# Patient Record
Sex: Male | Born: 1995 | Race: White | Hispanic: No | Marital: Married | Smoking: Never smoker
Health system: Southern US, Community
[De-identification: ages and names within clinical notes are randomized; demographics above are authoritative.]

## PROBLEM LIST (undated history)

## (undated) HISTORY — PX: SKIN GRAFT: SHX250

---

## 2020-03-17 ENCOUNTER — Emergency Department (HOSPITAL_BASED_OUTPATIENT_CLINIC_OR_DEPARTMENT_OTHER)
Admission: EM | Admit: 2020-03-17 | Discharge: 2020-03-17 | Disposition: A | Discharge status: Home / Self Care | Payer: No Typology Code available for payment source | Attending: Emergency Medicine | Admitting: Emergency Medicine

## 2020-03-17 ENCOUNTER — Emergency Department (HOSPITAL_BASED_OUTPATIENT_CLINIC_OR_DEPARTMENT_OTHER): Payer: No Typology Code available for payment source

## 2020-03-17 ENCOUNTER — Other Ambulatory Visit: Payer: Self-pay

## 2020-03-17 ENCOUNTER — Encounter (HOSPITAL_BASED_OUTPATIENT_CLINIC_OR_DEPARTMENT_OTHER): Payer: Self-pay

## 2020-03-17 DIAGNOSIS — Y990 Civilian activity done for income or pay: Secondary | ICD-10-CM | POA: Diagnosis not present

## 2020-03-17 DIAGNOSIS — Y92812 Truck as the place of occurrence of the external cause: Secondary | ICD-10-CM | POA: Insufficient documentation

## 2020-03-17 DIAGNOSIS — S4992XA Unspecified injury of left shoulder and upper arm, initial encounter: Secondary | ICD-10-CM | POA: Diagnosis present

## 2020-03-17 DIAGNOSIS — W230XXA Caught, crushed, jammed, or pinched between moving objects, initial encounter: Secondary | ICD-10-CM | POA: Diagnosis not present

## 2020-03-17 DIAGNOSIS — S50812A Abrasion of left forearm, initial encounter: Secondary | ICD-10-CM | POA: Insufficient documentation

## 2020-03-17 NOTE — ED Provider Notes (Signed)
[facility] EMERGENCY DEPARTMENT Provider Note   CSN: [id number] Arrival date & time: 03/17/20  1326     History Chief Complaint  Patient presents with  . Arm Injury    Curtis Chambers is a 25 y.o. male.  HPI Patient is a 25 year old male presenting today with left forearm pain after his sliding 18 wheeler cargo truck door rolled closed under his arm today at work at approximately 11 AM this morning.  He states that he has an abrasion on his arm which is irritating states that it was significant pain at first however has improved since that time.  He states that his pain is now dull and achy and more both 3/10.  He has taken no medications for pain prior to arrival or during his ER stay yet.  He denies any numbness or weakness of the upper extremity.  He denies any significant swelling Denies any significant weakness. No other associated symptoms no other aggravating or mitigating factors apart from worse with pushing on the abrasion.    History reviewed. No pertinent past medical history.  There are no problems to display for this patient.   Past Surgical History:  Procedure Laterality Date  . SKIN GRAFT         No family history on file.  Social History   Tobacco Use  . Smoking status: Never Smoker  . Smokeless tobacco: Current User  Vaping Use  . Vaping Use: Never used  Substance Use Topics  . Alcohol use: Yes    Comment: occ  . Drug use: Never    Home Medications Prior to Admission medications   Not on File    Allergies    Patient has no known allergies.  Review of Systems   Review of Systems  Constitutional: Negative for fever.  HENT: Negative for congestion.   Respiratory: Negative for shortness of breath.   Cardiovascular: Negative for chest pain.  Gastrointestinal: Negative for abdominal distention.  Musculoskeletal:       Forearm pain  Neurological: Negative for dizziness and headaches.    Physical Exam Updated Vital Signs BP  138/81 (BP Location: Right Arm)   Pulse 77   Temp 98.4 F (36.9 C) (Oral)   Resp 16   Ht 5\' 10"  (1.778 m)   Wt 104.3 kg   SpO2 100%   BMI 33.00 kg/m   Physical Exam Vitals and nursing note reviewed.  Constitutional:      General: He is not in acute distress.    Appearance: Normal appearance. He is not ill-appearing.  HENT:     Head: Normocephalic and atraumatic.  Eyes:     General: No scleral icterus.       Right eye: No discharge.        Left eye: No discharge.     Conjunctiva/sclera: Conjunctivae normal.  Pulmonary:     Effort: Pulmonary effort is normal.     Breath sounds: No stridor.  Musculoskeletal:     Comments: Left forearm with abrasion over the dorsal aspect approximately mid forearm. Compartments are soft nontender to palpation.  There is no bony tenderness.  No bruising or laceration.  Sensation intact in all fingertips able to make okay sign, extend wrist and abduct thumb to pinky.   Skin:    General: Skin is warm and dry.     Capillary Refill: Capillary refill takes less than 2 seconds.     Comments: Small abrasion on dorsal forearm  Neurological:     Mental  Status: He is alert and oriented to person, place, and time. Mental status is at baseline.     ED Results / Procedures / Treatments   Labs (all labs ordered are listed, but only abnormal results are displayed) Labs Reviewed - No data to display  EKG None  Radiology DG Forearm Left  Result Date: 03/17/2020 CLINICAL DATA:  Sherrine Maples to the left forearm on a trailer truck door today. Initial encounter. EXAM: LEFT FOREARM - 2 VIEW COMPARISON:  None. FINDINGS: There is no evidence of fracture or other focal bone lesions. Soft tissues are unremarkable. IMPRESSION: Negative exam. Electronically Signed   By: Drusilla Kanner M.D.   On: 03/17/2020 14:09    Procedures Procedures (including critical care time)  Medications Ordered in ED Medications - No data to display  ED Course  I have reviewed the  triage vital signs and the nursing notes.  Pertinent labs & imaging results that were available during my care of the patient were reviewed by me and considered in my medical decision making (see chart for details).    MDM Rules/Calculators/A&P                          Patient is 25 year old male presented today with left forearm pain physical exam is unremarkable apart from small abrasion right forearm and some tenderness.  He has soft compartments.  X-ray shows no fracture.  Will discharge with Tylenol ibuprofen and follow-up with PCP will return to the ER for new or concerning symptoms I discussed with patient and educated him on symptoms of compartment syndrome.  He is understanding of plan to discharge this time.  No further questions at this time.  Final Clinical Impression(s) / ED Diagnoses Final diagnoses:  Injury of left upper extremity, initial encounter    Rx / DC Orders ED Discharge Orders    None       Gailen Shelter, [place] 03/17/20 1916    Vanetta Mulders, MD 03/19/20 1859

## 2020-03-17 NOTE — Discharge Instructions (Signed)
Your x-ray was negative for fracture.  You may always return to the ER for any new or concerning symptoms such as specific symptoms we discussed which are numbness, cold hand, decreased sensation or other new or concerning symptoms.  Please use Tylenol or ibuprofen for pain.  You may use 600 mg ibuprofen every 6 hours or 1000 mg of Tylenol every 6 hours.  You may choose to alternate between the 2.  This would be most effective.  Not to exceed 4 g of Tylenol within 24 hours.  Not to exceed 3200 mg ibuprofen 24 hours.

## 2020-03-17 NOTE — ED Triage Notes (Addendum)
Pt states a trailer door came down on left UE at work ~11am-abrasion to left FA-NAD-steady gait-pt's [employer] paper work does not have any drug testing options selected

## 2021-10-03 ENCOUNTER — Emergency Department (EMERGENCY_DEPARTMENT_HOSPITAL): Payer: Managed Care, Other (non HMO) | Admitting: Certified Registered Nurse Anesthetist

## 2021-10-03 ENCOUNTER — Emergency Department (HOSPITAL_COMMUNITY): Payer: Managed Care, Other (non HMO) | Admitting: Certified Registered Nurse Anesthetist

## 2021-10-03 ENCOUNTER — Ambulatory Visit (HOSPITAL_BASED_OUTPATIENT_CLINIC_OR_DEPARTMENT_OTHER)
Admission: EM | Admit: 2021-10-03 | Discharge: 2021-10-04 | Disposition: A | Discharge status: Home / Self Care | Payer: Managed Care, Other (non HMO) | Attending: Emergency Medicine | Admitting: Emergency Medicine

## 2021-10-03 ENCOUNTER — Encounter (HOSPITAL_COMMUNITY)
Admission: EM | Disposition: A | Discharge status: Home / Self Care | Payer: Self-pay | Source: Home / Self Care | Attending: Emergency Medicine

## 2021-10-03 ENCOUNTER — Encounter (HOSPITAL_BASED_OUTPATIENT_CLINIC_OR_DEPARTMENT_OTHER): Payer: Self-pay | Admitting: Emergency Medicine

## 2021-10-03 ENCOUNTER — Other Ambulatory Visit: Payer: Self-pay

## 2021-10-03 DIAGNOSIS — T18128A Food in esophagus causing other injury, initial encounter: Secondary | ICD-10-CM

## 2021-10-03 DIAGNOSIS — K222 Esophageal obstruction: Secondary | ICD-10-CM

## 2021-10-03 DIAGNOSIS — K2289 Other specified disease of esophagus: Secondary | ICD-10-CM | POA: Diagnosis not present

## 2021-10-03 DIAGNOSIS — X58XXXA Exposure to other specified factors, initial encounter: Secondary | ICD-10-CM | POA: Diagnosis not present

## 2021-10-03 HISTORY — PX: FOREIGN BODY REMOVAL: SHX962

## 2021-10-03 HISTORY — PX: BIOPSY: SHX5522

## 2021-10-03 HISTORY — PX: ESOPHAGOGASTRODUODENOSCOPY: SHX5428

## 2021-10-03 SURGERY — EGD (ESOPHAGOGASTRODUODENOSCOPY)
Anesthesia: General

## 2021-10-03 MED ORDER — ONDANSETRON HCL 4 MG/2ML IJ SOLN
INTRAMUSCULAR | Status: DC | PRN
  Administered 2021-10-03: 4 mg via INTRAVENOUS

## 2021-10-03 MED ORDER — FENTANYL CITRATE (PF) 100 MCG/2ML IJ SOLN
INTRAMUSCULAR | Status: DC | PRN
  Administered 2021-10-03: 100 ug via INTRAVENOUS

## 2021-10-03 MED ORDER — SUCCINYLCHOLINE CHLORIDE 200 MG/10ML IV SOSY
PREFILLED_SYRINGE | INTRAVENOUS | Status: DC | PRN
  Administered 2021-10-03: 140 mg via INTRAVENOUS

## 2021-10-03 MED ORDER — NITROGLYCERIN 0.4 MG SL SUBL
0.4000 mg | SUBLINGUAL_TABLET | Freq: Once | SUBLINGUAL | Status: AC
  Administered 2021-10-03: 0.4 mg via SUBLINGUAL
  Filled 2021-10-03: qty 1

## 2021-10-03 MED ORDER — LACTATED RINGERS IV SOLN
INTRAVENOUS | Status: AC | PRN
  Administered 2021-10-03: 20 mL/h via INTRAVENOUS

## 2021-10-03 MED ORDER — FENTANYL CITRATE (PF) 100 MCG/2ML IJ SOLN
INTRAMUSCULAR | Status: AC
  Filled 2021-10-03: qty 2

## 2021-10-03 MED ORDER — ONDANSETRON HCL 4 MG/2ML IJ SOLN: 4.0000 mg | Freq: Once | INTRAMUSCULAR | Status: DC | PRN

## 2021-10-03 MED ORDER — PROPOFOL 10 MG/ML IV BOLUS
INTRAVENOUS | Status: AC
  Filled 2021-10-03: qty 20

## 2021-10-03 MED ORDER — ONDANSETRON HCL 4 MG/2ML IJ SOLN
4.0000 mg | Freq: Once | INTRAMUSCULAR | Status: AC
  Administered 2021-10-03: 4 mg via INTRAVENOUS
  Filled 2021-10-03: qty 2

## 2021-10-03 MED ORDER — LIDOCAINE HCL (CARDIAC) PF 100 MG/5ML IV SOSY
PREFILLED_SYRINGE | INTRAVENOUS | Status: DC | PRN
Start: 2021-10-03 — End: 2021-10-04
  Administered 2021-10-03: 100 mg via INTRAVENOUS

## 2021-10-03 MED ORDER — GLUCAGON HCL RDNA (DIAGNOSTIC) 1 MG IJ SOLR
1.0000 mg | Freq: Once | INTRAMUSCULAR | Status: AC
  Administered 2021-10-03: 1 mg via INTRAVENOUS
  Filled 2021-10-03: qty 1

## 2021-10-03 MED ORDER — FENTANYL CITRATE (PF) 100 MCG/2ML IJ SOLN: 25.0000 ug | INTRAMUSCULAR | Status: DC | PRN

## 2021-10-03 MED ORDER — PROPOFOL 10 MG/ML IV BOLUS
INTRAVENOUS | Status: DC | PRN
  Administered 2021-10-03: 200 mg via INTRAVENOUS

## 2021-10-03 NOTE — ED Notes (Signed)
Pt given po challenge of Soda and had no result, unable to swallow at this time,

## 2021-10-03 NOTE — ED Notes (Signed)
Patient is alert x4 Breathing non labored Denies any shortness of breath

## 2021-10-03 NOTE — H&P (Signed)
[facility] H/P Note  Chief Complaint: esophageal foreign body  HPI: Curtis Chambers is an 26 y.o. male.  Suspected esophageal foreign body (steak) abruptly after eating this evening.  No prior issues with dysphagia.  Had some trouble breath as well, initially, requiring abdominal thrust maneuver.  No abdominal pain, blood in stool, unintentional weight loss.  No prior endoscopy or colonoscopy.  History reviewed. No pertinent past medical history.  Past Surgical History:  Procedure Laterality Date   SKIN GRAFT      No medications prior to admission.    Allergies:  Allergies  Allergen Reactions   Cephalexin Rash    Informant mother Informant mother    Penicillins Rash    History reviewed. No pertinent family history.  Social History:  reports that he has never smoked. He uses smokeless tobacco. He reports current alcohol use. He reports that he does not use drugs.   ROS: As per HPI, all others negative   Blood pressure (!) 169/102, pulse 73, temperature 97.7 F (36.5 C), temperature source Temporal, resp. rate 14, height 5\' 10"  (1.778 m), weight 102.1 kg, SpO2 100 %. General appearance: NAD HEENT:  [place]/AT, anicteric NECK:  Thick, supple CV:  Regular ABD:  Soft, non-tender SKIN:  No jaundice NEURO:  A/O, no encephalopathy  No results found for this or any previous visit (from the past 48 hour(s)). No results found.  Assessment/Plan   Suspected esophageal food bolus. Endoscopy with possible foreign body removal. Risks (bleeding, infection, bowel perforation that could require surgery, sedation-related changes in cardiopulmonary systems), benefits (identification and possible treatment of source of symptoms, exclusion of certain causes of symptoms), and alternatives (watchful waiting, radiographic imaging studies, empiric medical treatment) of upper endoscopy (EGD) were explained to patient/family in detail and patient wishes to proceed.   10/03/2021, 11:09 PM

## 2021-10-03 NOTE — ED Notes (Signed)
IV secured with coban, pt stable for transfer

## 2021-10-03 NOTE — ED Notes (Signed)
Pt left our facility POV to head to [facility], pt wife to drive.

## 2021-10-03 NOTE — ED Notes (Signed)
ED Provider at bedside. 

## 2021-10-03 NOTE — ED Triage Notes (Signed)
Pt arrives pov, endorses choking on a piece of steak 45 mins pta. Pt reports still feels like portion is still stuck, unable to keep water down.

## 2021-10-03 NOTE — ED Provider Notes (Signed)
[facility] EMERGENCY DEPARTMENT Provider Note   CSN: [id number] Arrival date & time: 10/03/21  1835     History  Chief Complaint  Patient presents with   Foreign Body    Curtis Chambers is a 26 y.o. male.  HPI     26 year old male comes in with chief complaint of foreign body.  Patient states that he was having steak at home about an hour prior to ED arrival when he started choking all of a sudden.  His father had to perform Heimlich maneuver to help him because he was also having difficulty in breathing.  He spit out a big piece of steak, but continues to have difficulty in swallowing.  Anytime he drinks water, it just comes out.  No history of prior food impaction.  Home Medications Prior to Admission medications   Not on File      Allergies    Cephalexin and Penicillins    Review of Systems   Review of Systems  All other systems reviewed and are negative.   Physical Exam Updated Vital Signs BP 126/80 (BP Location: Right Arm)   Pulse 79   Temp 97.6 F (36.4 C) (Oral)   Resp 18   Ht 5\' 10"  (1.778 m)   Wt 102.1 kg   SpO2 98%   BMI 32.28 kg/m  Physical Exam Vitals and nursing note reviewed.  Constitutional:      Appearance: He is well-developed.  HENT:     Head: Atraumatic.  Cardiovascular:     Rate and Rhythm: Normal rate.  Pulmonary:     Effort: Pulmonary effort is normal.  Musculoskeletal:     Cervical back: Neck supple.  Skin:    General: Skin is warm.  Neurological:     Mental Status: He is alert and oriented to person, place, and time.     ED Results / Procedures / Treatments   Labs (all labs ordered are listed, but only abnormal results are displayed) Labs Reviewed - No data to display  EKG EKG Interpretation  Date/Time:  Sunday October 03 2021 18:44:00 EDT Ventricular Rate:  75 PR Interval:  154 QRS Duration: 80 QT Interval:  360 QTC Calculation: 402 R Axis:   59 Text Interpretation: Normal sinus rhythm Normal ECG No  previous ECGs available No old tracing to compare Confirmed by [id number] [phone]) on 10/03/2021 7:59:22 PM  Radiology No results found.  Procedures Procedures    Medications Ordered in ED Medications  glucagon (human recombinant) (GLUCAGEN) injection 1 mg (1 mg Intravenous Given 10/03/21 1931)  ondansetron (ZOFRAN) injection 4 mg (4 mg Intravenous Given 10/03/21 1930)  nitroGLYCERIN (NITROSTAT) SL tablet 0.4 mg (0.4 mg Sublingual Given 10/03/21 1931)    ED Course/ Medical Decision Making/ A&P                           Medical Decision Making Risk Prescription drug management.   26 year old patient comes in with chief complaint of difficulty in swallowing.  It appears that he had a choking spell prior to ED arrival.  He has no medical problems.  Subsequently, anytime he has tried to drink water he is spitting out.  I walk into the room, patient appears comfortable but he is sitting with an emesis bag, intermittently having to spit in it.  He is spitting out his own saliva.  Differential diagnosis essentially includes globus pharyngeus versus esophageal food impaction.  Leaning towards the latter given that patient  is constantly spitting out.  Labs and imaging not indicated.  Patient does not have any airway compromise at this time  We gave patient sublingual nitroglycerin, glucagon IV along with Zofran and also attempted carbonated beverage and jumping jacks.  Patient continues to have difficulty swallowing.  I consulted Dr. Dulce Sellar, GI.  He recommends that patient be transferred to [facility] emergency room.  Patient made aware of the plan for ED to ED transfer.  Dr. Stevie Kern made aware at [facility] long ED.  Patient going via private conveyance.  Final Clinical Impression(s) / ED Diagnoses Final diagnoses:  Esophageal obstruction due to food impaction    Rx / DC Orders ED Discharge Orders     None         Derwood Kaplan, MD 10/03/21 2100

## 2021-10-03 NOTE — Anesthesia Preprocedure Evaluation (Signed)
Anesthesia Evaluation  Patient identified by MRN, date of birth, ID band Patient awake    Reviewed: Allergy & Precautions, NPO status , Patient's Chart, lab work & pertinent test results  Airway Mallampati: II  TM Distance: >3 FB Neck ROM: Full    Dental no notable dental hx.    Pulmonary neg pulmonary ROS,    Pulmonary exam normal breath sounds clear to auscultation       Cardiovascular negative cardio ROS Normal cardiovascular exam Rhythm:Regular Rate:Normal     Neuro/Psych negative neurological ROS  negative psych ROS   GI/Hepatic negative GI ROS, Neg liver ROS,   Endo/Other  obesity  Renal/GU negative Renal ROS  negative genitourinary   Musculoskeletal negative musculoskeletal ROS (+)   Abdominal   Peds negative pediatric ROS (+)  Hematology negative hematology ROS (+)   Anesthesia Other Findings   Reproductive/Obstetrics negative OB ROS                             Anesthesia Physical Anesthesia Plan  ASA: 2 and emergent  Anesthesia Plan: General   Post-op Pain Management:    Induction: Intravenous and Rapid sequence  PONV Risk Score and Plan: 2 and Ondansetron and Treatment may vary due to age or medical condition  Airway Management Planned: Oral ETT  Additional Equipment:   Intra-op Plan:   Post-operative Plan: Extubation in OR  Informed Consent: I have reviewed the patients History and Physical, chart, labs and discussed the procedure including the risks, benefits and alternatives for the proposed anesthesia with the patient or authorized representative who has indicated his/her understanding and acceptance.     Dental advisory given  Plan Discussed with: CRNA and Surgeon  Anesthesia Plan Comments:         Anesthesia Quick Evaluation

## 2021-10-03 NOTE — Anesthesia Procedure Notes (Signed)
Procedure Name: Intubation Date/Time: 10/03/2021 11:11 PM  Performed by: Raenette Rover, CRNAPre-anesthesia Checklist: Patient identified, Emergency Drugs available, Suction available and Patient being monitored Patient Re-evaluated:Patient Re-evaluated prior to induction Oxygen Delivery Method: Circle system utilized Preoxygenation: Pre-oxygenation with 100% oxygen Induction Type: IV induction, Rapid sequence and Cricoid Pressure applied Laryngoscope Size: Mac and 4 Grade View: Grade I Tube type: Oral Tube size: 7.5 mm Number of attempts: 1 Airway Equipment and Method: Stylet Placement Confirmation: ETT inserted through vocal cords under direct vision, positive ETCO2 and breath sounds checked- equal and bilateral Secured at: 23 cm Tube secured with: Tape Dental Injury: Teeth and Oropharynx as per pre-operative assessment

## 2021-10-03 NOTE — ED Notes (Signed)
Gastroenterology called to inform them pt has arrived to [facility]

## 2021-10-04 ENCOUNTER — Encounter (HOSPITAL_COMMUNITY): Payer: Self-pay | Admitting: Gastroenterology

## 2021-10-04 NOTE — Discharge Instructions (Addendum)
YOU HAD AN ENDOSCOPIC PROCEDURE TODAY: Refer to the procedure report and other information in the discharge instructions given to you for any specific questions about what was found during the examination. If this information does not answer your questions, please call [facility] office at [phone] to clarify.   YOU SHOULD EXPECT: Some feelings of bloating in the abdomen. Passage of more gas than usual. Walking can help get rid of the air that was put into your GI tract during the procedure and reduce the bloating. If you had a lower endoscopy (such as a colonoscopy or flexible sigmoidoscopy) you may notice spotting of blood in your stool or on the toilet paper. Some abdominal soreness may be present for a day or two, also.  DIET: Your first meal following the procedure should be a light meal and then it is ok to progress to your normal diet. A half-sandwich or bowl of soup is an example of a good first meal. Heavy or fried foods are harder to digest and may make you feel nauseous or bloated. Drink plenty of fluids but you should avoid alcoholic beverages for 24 hours. If you had a esophageal dilation, please see attached instructions for diet.    ACTIVITY: Your care partner should take you home directly after the procedure. You should plan to take it easy, moving slowly for the rest of the day. You can resume normal activity the day after the procedure however YOU SHOULD NOT DRIVE, use power tools, machinery or perform tasks that involve climbing or major physical exertion for 24 hours (because of the sedation medicines used during the test).   SYMPTOMS TO REPORT IMMEDIATELY: A gastroenterologist can be reached at any hour. Please call [phone]  for any of the following symptoms:  Following lower endoscopy (colonoscopy, flexible sigmoidoscopy) Excessive amounts of blood in the stool  Significant tenderness, worsening of abdominal pains  Swelling of the abdomen that is new, acute  Fever of 100  or higher  Following upper endoscopy (EGD, EUS, ERCP, esophageal dilation) Vomiting of blood or coffee ground material  New, significant abdominal pain  New, significant chest pain or pain under the shoulder blades  Painful or persistently difficult swallowing  New shortness of breath  Black, tarry-looking or red, bloody stools  FOLLOW UP:  If any biopsies were taken you will be contacted by phone or by letter within the next 1-3 weeks. Call [phone]  if you have not heard about the biopsies in 3 weeks.  Please also call with any specific questions about appointments or follow up tests. YOU HAD AN ENDOSCOPIC PROCEDURE TODAY: Refer to the procedure report and other information in the discharge instructions given to you for any specific questions about what was found during the examination. If this information does not answer your questions, please call [facility] office at [phone] to clarify.   YOU SHOULD EXPECT: Some feelings of bloating in the abdomen. Passage of more gas than usual. Walking can help get rid of the air that was put into your GI tract during the procedure and reduce the bloating. If you had a lower endoscopy (such as a colonoscopy or flexible sigmoidoscopy) you may notice spotting of blood in your stool or on the toilet paper. Some abdominal soreness may be present for a day or two, also.  DIET: You should have soft diet (mashed potatoes, rice, pudding, well-chopped meats) for the next 24 hours, then slowly advance to regular diet.  Chew food carefully, take small bites, eat  slowly; otherwise, you may have another episode resulting in food in your esophagus. Drink plenty of fluids but you should avoid alcoholic beverages for 24 hours. If you had a esophageal dilation, please see attached instructions for diet.    ACTIVITY: Your care partner should take you home directly after the procedure. You should plan to take it easy, moving slowly for the rest of the day. You can resume  normal activity the day after the procedure however YOU SHOULD NOT DRIVE, YOU SHOULD NOT WORK, use power tools, machinery or perform tasks that involve climbing or major physical exertion for 24 hours (because of the sedation medicines used during the test).   SYMPTOMS TO REPORT IMMEDIATELY: A gastroenterologist can be reached at any hour. Please call [phone]  for any of the following symptoms:  Following upper endoscopy (EGD, EUS, ERCP, esophageal dilation) Vomiting of blood or coffee ground material  New, significant abdominal pain  New, significant chest pain or pain under the shoulder blades  Painful or persistently difficult swallowing  New shortness of breath  Black, tarry-looking or red, bloody stools  FOLLOW UP:  If any biopsies were taken you will be contacted by phone or by letter within the next 1-3 weeks. Call [phone]  if you have not heard about the biopsies in 3 weeks.  Please also call with any specific questions about appointments or follow up tests.

## 2021-10-04 NOTE — ED Provider Notes (Signed)
Clinical Course as of 10/04/21 0441  Wynelle Link Oct 03, 2021  2211 Dr. Dulce Sellar aware of patient arrival to the [facility] ED and will assess him in the department. [KH]  2300 Patient transferred OTF for EGD. [KH]    Clinical Course User Index [KH] Geanie Kenning, PA-C 10/04/21 0441    Tilden Fossa, MD 10/05/21 [phone]

## 2021-10-04 NOTE — Transfer of Care (Signed)
Immediate Anesthesia Transfer of Care Note  Patient: Curtis Chambers  Procedure(s) Performed: ESOPHAGOGASTRODUODENOSCOPY (EGD) FOREIGN BODY REMOVAL BIOPSY  Patient Location: Endoscopy Unit  Anesthesia Type:General  Level of Consciousness: drowsy and patient cooperative  Airway & Oxygen Therapy: Patient Spontanous Breathing and Patient connected to face mask oxygen  Post-op Assessment: Report given to RN and Post -op Vital signs reviewed and stable  Post vital signs: Reviewed and stable  Last Vitals:  Vitals Value Taken Time  BP 170/90 10/04/21 0036  Temp    Pulse 87 10/04/21 0037  Resp 10 10/04/21 0037  SpO2 100 % 10/04/21 0037  Vitals shown include unvalidated device data.  Last Pain:  Vitals:   10/03/21 2305  TempSrc: Temporal  PainSc:          Complications: No notable events documented.

## 2021-10-04 NOTE — Op Note (Signed)
[facility] Patient Name: Curtis Chambers Procedure Date: 10/03/2021 MRN: [id number] Attending MD: Willis Modena , MD Date of Birth: April 13, 1995 CSN: [id number] Age: 26 Admit Type: Outpatient Procedure:                Upper GI endoscopy Indications:              Foreign body in the esophagus Providers:                Willis Modena, MD, Vicki Mallet, RN, Rozetta Nunnery, Technician Referring MD:              Medicines:                General Anesthesia Complications:             Estimated Blood Loss:     Estimated blood loss: none. Procedure:                Pre-Anesthesia Assessment:                           - Prior to the procedure, a History and Physical                            was performed, and patient medications and                            allergies were reviewed. The patient's tolerance of                            previous anesthesia was also reviewed. The risks                            and benefits of the procedure and the sedation                            options and risks were discussed with the patient.                            All questions were answered, and informed consent                            was obtained. Prior Anticoagulants: The patient has                            taken no previous anticoagulant or antiplatelet                            agents. ASA Grade Assessment: II - A patient with                            mild systemic disease. After reviewing the risks                            and  benefits, the patient was deemed in                            satisfactory condition to undergo the procedure.                           After obtaining informed consent, the endoscope was                            passed under direct vision. Throughout the                            procedure, the patient's blood pressure, pulse, and                            oxygen saturations were monitored continuously. The                             [id number]-[id number] ([id number] Olympus endoscope was introduced                            through the mouth, and advanced to the second part                            of duodenum. The upper GI endoscopy was                            accomplished without difficulty. The patient                            tolerated the procedure well. Scope In: Scope Out: Findings:      Mucosal changes consistent with eosinophilic esophagitis were found in       the entire esophagus. Biopsies were obtained from the proximal and       distal esophagus with cold forceps for further evaluation.      Food was found in the middle third of the esophagus and in the lower       third of the esophagus. Removal of food was accomplished.      The exam of the esophagus was otherwise normal.      The entire examined stomach was normal.      The duodenal bulb, first portion of the duodenum and second portion of       the duodenum were normal. Impression:               - Esophageal mucosal changes consistent with                            eosinophilic esophagitis. Biopsied.                           - Food in the middle third of the esophagus and in                            the lower third of the esophagus. Removal was  successful.                           - Normal stomach.                           - Normal duodenal bulb, first portion of the                            duodenum and second portion of the duodenum. Moderate Sedation:      None Recommendation:           - Discharge patient to home (via wheelchair).                           - Soft diet for 1 day.                           - Await pathology results. Treat if EOE confirmed.                           - Return to GI clinic after studies are complete.                           - Return to referring physician as previously                            scheduled. Procedure Code(s):        --- Professional ---                            [phone], Esophagogastroduodenoscopy, flexible,                            transoral; with removal of foreign body(s)                           [id number], Esophagogastroduodenoscopy, flexible,                            transoral; with biopsy, single or multiple Diagnosis Code(s):        --- Professional ---                           K22.8, Other specified diseases of esophagus                           T18.128A, Food in esophagus causing other injury,                            initial encounter                           T18.108A, Unspecified foreign body in esophagus                            causing other injury, initial encounter CPT copyright 2019 [employer]. All  rights reserved. The codes documented in this report are preliminary and upon coder review may  be revised to meet current compliance requirements. Willis Modena, MD 10/04/2021 12:37:49 AM This report has been signed electronically. Number of Addenda: 0

## 2021-10-05 LAB — SURGICAL PATHOLOGY

## 2021-10-05 NOTE — Anesthesia Postprocedure Evaluation (Signed)
Anesthesia Post Note  Patient: Curtis Chambers  Procedure(s) Performed: ESOPHAGOGASTRODUODENOSCOPY (EGD) FOREIGN BODY REMOVAL BIOPSY     Patient location during evaluation: PACU Anesthesia Type: General Level of consciousness: awake and alert Pain management: pain level controlled Vital Signs Assessment: post-procedure vital signs reviewed and stable Respiratory status: spontaneous breathing, nonlabored ventilation, respiratory function stable and patient connected to nasal cannula oxygen Cardiovascular status: blood pressure returned to baseline and stable Postop Assessment: no apparent nausea or vomiting Anesthetic complications: no   No notable events documented.  Last Vitals:  Vitals:   10/04/21 0050 10/04/21 0100  BP: (!) 162/91 (!) 149/91  Pulse: 83 79  Resp: 17 19  Temp:    SpO2: 99% 99%    Last Pain:  Vitals:   10/04/21 0050  TempSrc:   PainSc: 0-No pain                 Kal Chait S

## 2022-08-23 IMAGING — CR DG FOREARM 2V*L*
2 series · 2 of 2 positions shown · non-contrast
Comparison: None.

CLINICAL DATA: Blow to the left forearm on a trailer truck door
today. Initial encounter.

EXAM:
LEFT FOREARM - 2 VIEW

[x forearm ap left]
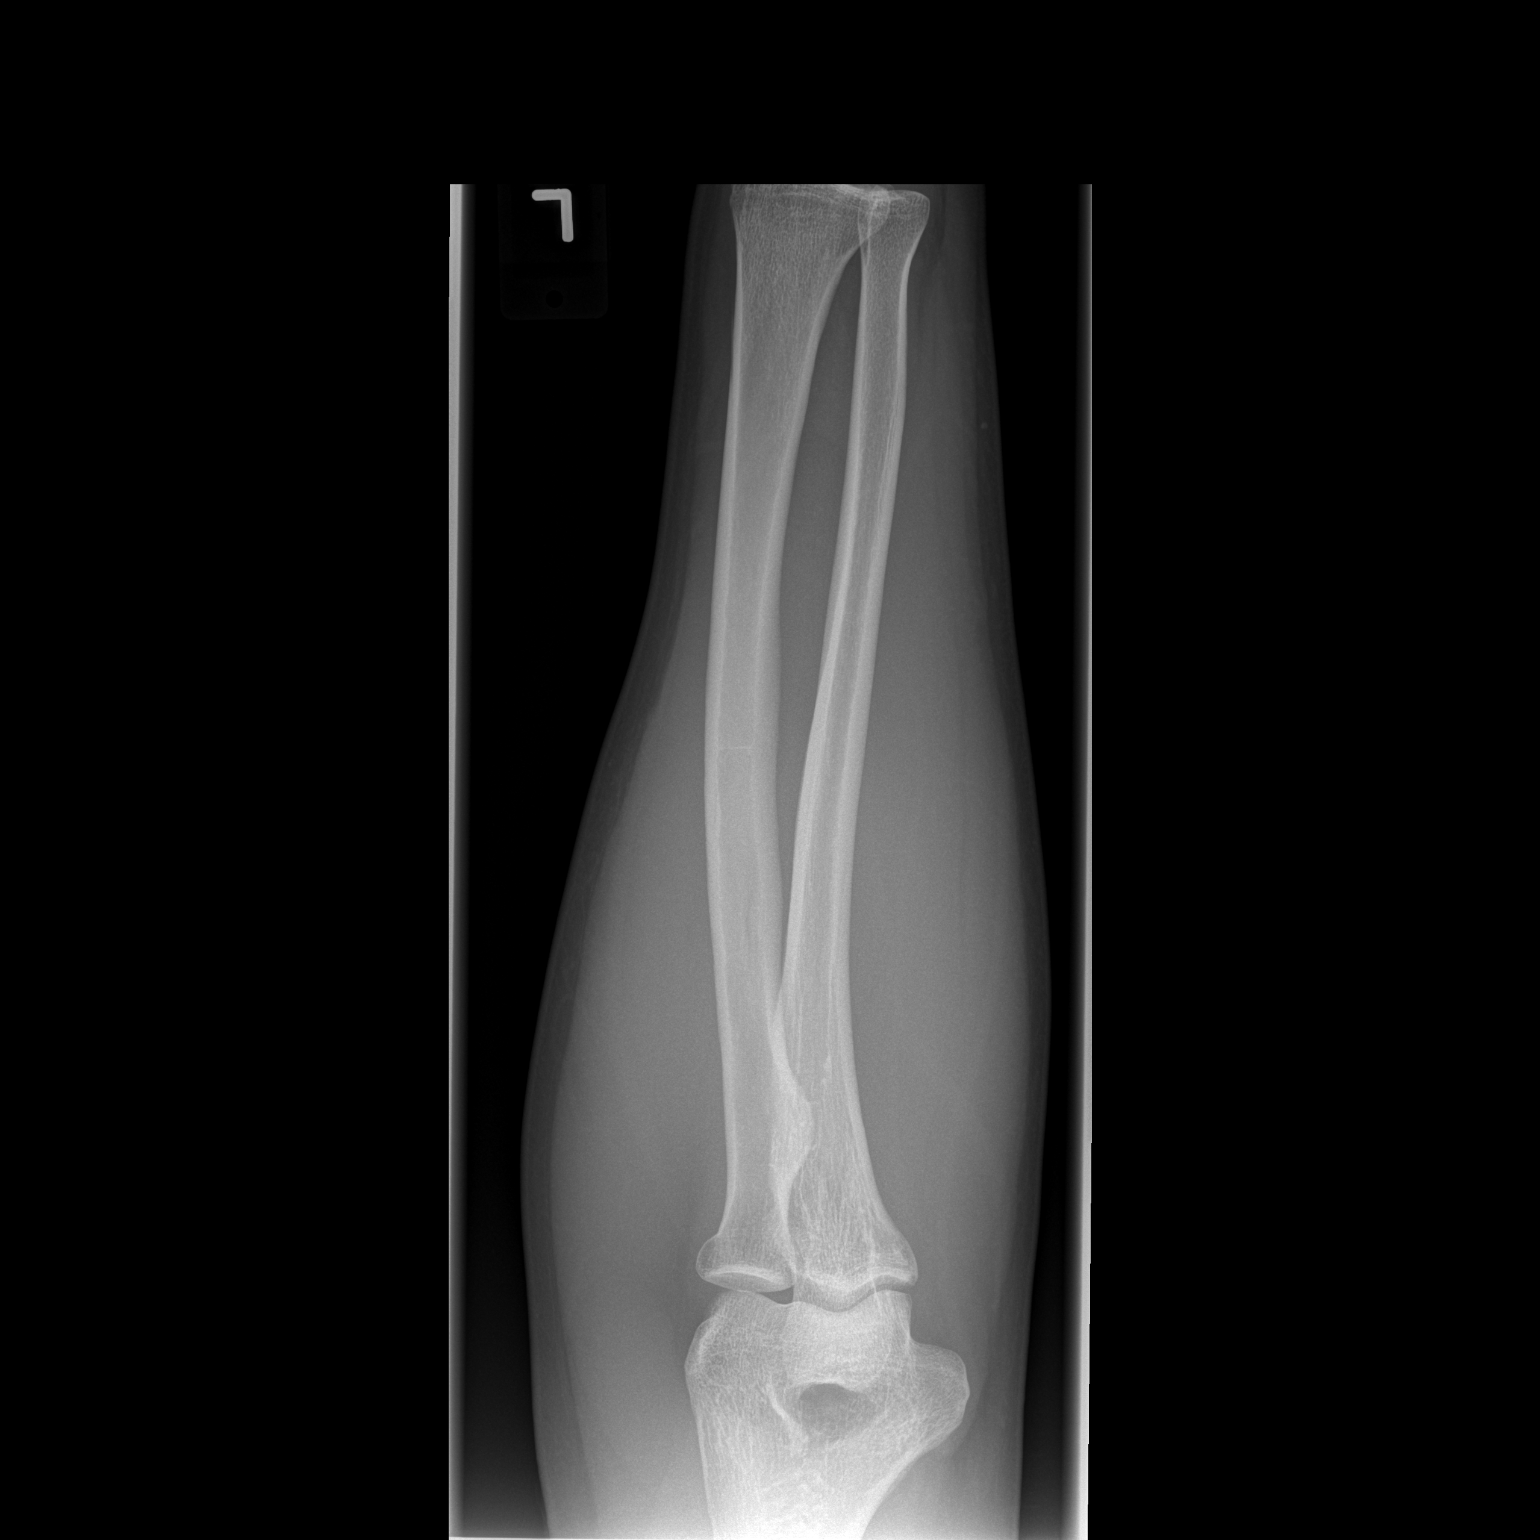

[x forearm lat left]
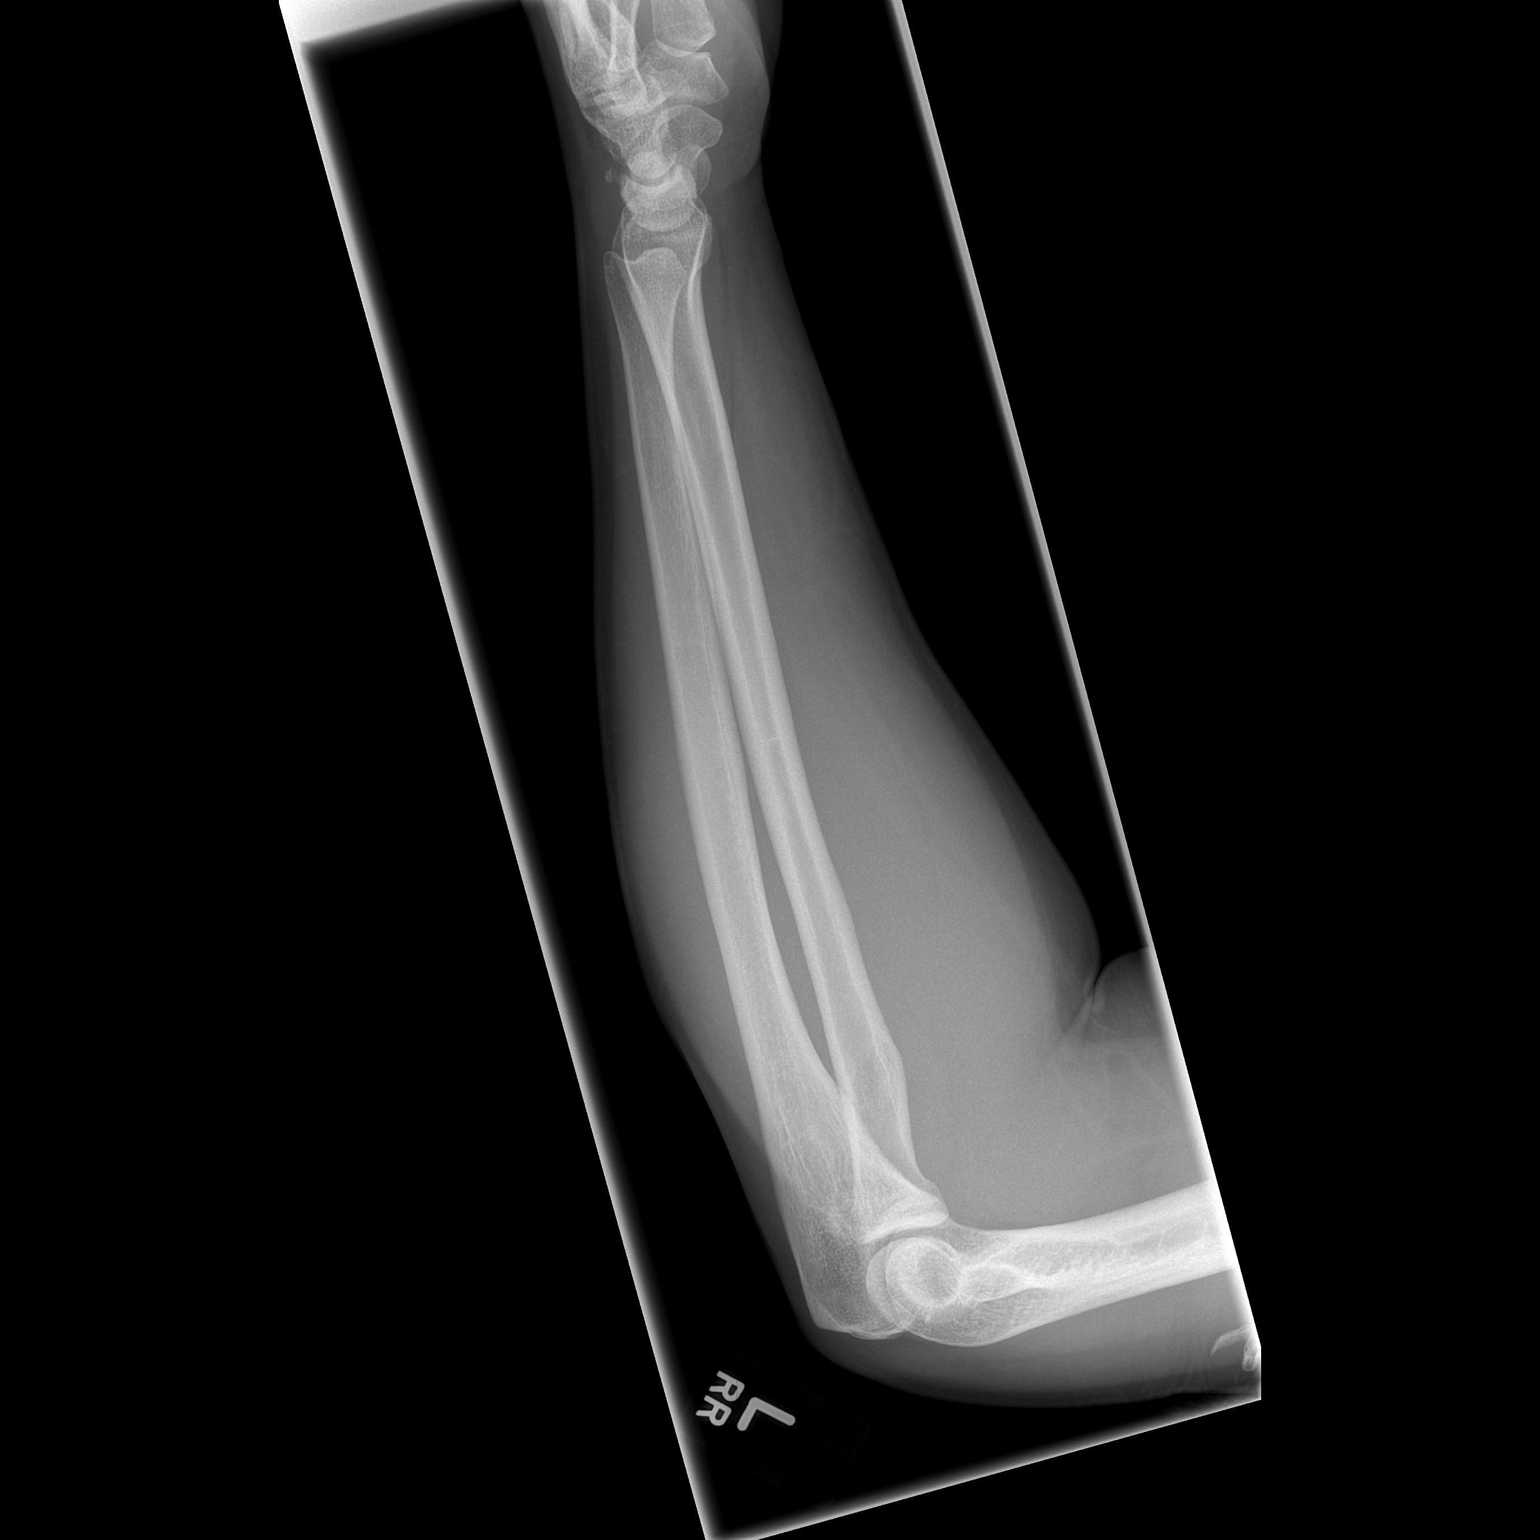

[2 of 2 positions shown; findings below may reference images not displayed]

FINDINGS: There is no evidence of fracture or other focal bone lesions. Soft
tissues are unremarkable.
IMPRESSION: Negative exam.
